# Patient Record
Sex: Female | Born: 1990 | Race: Black or African American | Hispanic: No | Marital: Single | Smoking: Never smoker
Health system: Southern US, Community
[De-identification: ages and names within clinical notes are randomized; demographics above are authoritative.]

## PROBLEM LIST (undated history)

## (undated) HISTORY — PX: BUNIONECTOMY: SHX129

---

## 2015-02-11 ENCOUNTER — Encounter (HOSPITAL_COMMUNITY): Payer: Self-pay | Admitting: Emergency Medicine

## 2015-02-11 ENCOUNTER — Emergency Department (HOSPITAL_COMMUNITY)
Admission: EM | Admit: 2015-02-11 | Discharge: 2015-02-11 | Disposition: A | Discharge status: Home / Self Care | Payer: Self-pay | Attending: Emergency Medicine | Admitting: Emergency Medicine

## 2015-02-11 ENCOUNTER — Emergency Department (HOSPITAL_COMMUNITY): Payer: Self-pay

## 2015-02-11 DIAGNOSIS — M7918 Myalgia, other site: Secondary | ICD-10-CM

## 2015-02-11 DIAGNOSIS — S76911A Strain of unspecified muscles, fascia and tendons at thigh level, right thigh, initial encounter: Secondary | ICD-10-CM | POA: Insufficient documentation

## 2015-02-11 DIAGNOSIS — Y998 Other external cause status: Secondary | ICD-10-CM | POA: Insufficient documentation

## 2015-02-11 DIAGNOSIS — Y9389 Activity, other specified: Secondary | ICD-10-CM | POA: Insufficient documentation

## 2015-02-11 DIAGNOSIS — M79651 Pain in right thigh: Secondary | ICD-10-CM

## 2015-02-11 DIAGNOSIS — Y92410 Unspecified street and highway as the place of occurrence of the external cause: Secondary | ICD-10-CM | POA: Insufficient documentation

## 2015-02-11 DIAGNOSIS — T148XXA Other injury of unspecified body region, initial encounter: Secondary | ICD-10-CM

## 2015-02-11 DIAGNOSIS — Z3202 Encounter for pregnancy test, result negative: Secondary | ICD-10-CM | POA: Insufficient documentation

## 2015-02-11 LAB — POC URINE PREG, ED: PREG TEST UR: NEGATIVE

## 2015-02-11 MED ORDER — IBUPROFEN 800 MG PO TABS
800.0000 mg | ORAL_TABLET | Freq: Once | ORAL | Status: AC
  Administered 2015-02-11: 800 mg via ORAL
  Filled 2015-02-11: qty 1

## 2015-02-11 NOTE — Discharge Instructions (Signed)
Muscle Strain A muscle strain is an injury that occurs when a muscle is stretched beyond its normal length. Usually a small number of muscle fibers are torn when this happens. Muscle strain is rated in degrees. First-degree strains have the least amount of muscle fiber tearing and pain. Second-degree and third-degree strains have increasingly more tearing and pain.  Usually, recovery from muscle strain takes 1-2 weeks. Complete healing takes 5-6 weeks.  CAUSES  Muscle strain happens when a sudden, violent force placed on a muscle stretches it too far. This may occur with lifting, sports, or a fall.  RISK FACTORS Muscle strain is especially common in athletes.  SIGNS AND SYMPTOMS At the site of the muscle strain, there may be:  Pain.  Bruising.  Swelling.  Difficulty using the muscle due to pain or lack of normal function. DIAGNOSIS  Your health care provider will perform a physical exam and ask about your medical history. TREATMENT  Often, the best treatment for a muscle strain is resting, icing, and applying cold compresses to the injured area.  HOME CARE INSTRUCTIONS   Use the PRICE method of treatment to promote muscle healing during the first 2-3 days after your injury. The PRICE method involves:  Protecting the muscle from being injured again.  Restricting your activity and resting the injured body part.  Icing your injury. To do this, put ice in a plastic bag. Place a towel between your skin and the bag. Then, apply the ice and leave it on from 15-20 minutes each hour. After the third day, switch to moist heat packs.  Apply compression to the injured area with a splint or elastic bandage. Be careful not to wrap it too tightly. This may interfere with blood circulation or increase swelling.  Elevate the injured body part above the level of your heart as often as you can.  Only take over-the-counter or prescription medicines for pain, discomfort, or fever as directed by your  health care provider.  Warming up prior to exercise helps to prevent future muscle strains. SEEK MEDICAL CARE IF:   You have increasing pain or swelling in the injured area.  You have numbness, tingling, or a significant loss of strength in the injured area. MAKE SURE YOU:   Understand these instructions.  Will watch your condition.  Will get help right away if you are not doing well or get worse. Document Released: 07/02/2005 Document Revised: 04/22/2013 Document Reviewed: 01/29/2013 [employer] Patient Information 2015 [place], [place]. This information is not intended to replace advice given to you by your health care provider. Make sure you discuss any questions you have with your health care provider.  Musculoskeletal Pain Musculoskeletal pain is muscle and boney aches and pains. These pains can occur in any part of the body. Your caregiver may treat you without knowing the cause of the pain. They may treat you if blood or urine tests, X-rays, and other tests were normal.  CAUSES There is often not a definite cause or reason for these pains. These pains may be caused by a type of germ (virus). The discomfort may also come from overuse. Overuse includes working out too hard when your body is not fit. Boney aches also come from weather changes. Bone is sensitive to atmospheric pressure changes. HOME CARE INSTRUCTIONS   Ask when your test results will be ready. Make sure you get your test results.  Only take over-the-counter or prescription medicines for pain, discomfort, or fever as directed by your caregiver. If you  were given medications for your condition, do not drive, operate machinery or power tools, or sign legal documents for 24 hours. Do not drink alcohol. Do not take sleeping pills or other medications that may interfere with treatment.  Continue all activities unless the activities cause more pain. When the pain lessens, slowly resume normal activities. Gradually increase the  intensity and duration of the activities or exercise.  During periods of severe pain, bed rest may be helpful. Lay or sit in any position that is comfortable.  Putting ice on the injured area.  Put ice in a bag.  Place a towel between your skin and the bag.  Leave the ice on for 15 to 20 minutes, 3 to 4 times a day.  Follow up with your caregiver for continued problems and no reason can be found for the pain. If the pain becomes worse or does not go away, it may be necessary to repeat tests or do additional testing. Your caregiver may need to look further for a possible cause. SEEK IMMEDIATE MEDICAL CARE IF:  You have pain that is getting worse and is not relieved by medications.  You develop chest pain that is associated with shortness or breath, sweating, feeling sick to your stomach (nauseous), or throw up (vomit).  Your pain becomes localized to the abdomen.  You develop any new symptoms that seem different or that concern you. MAKE SURE YOU:   Understand these instructions.  Will watch your condition.  Will get help right away if you are not doing well or get worse. Document Released: 07/02/2005 Document Revised: 09/24/2011 Document Reviewed: 03/06/2013 [facility] Patient Information 2015 [place], [place]. This information is not intended to replace advice given to you by your health care provider. Make sure you discuss any questions you have with your health care provider.
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
## 2015-12-04 ENCOUNTER — Emergency Department (HOSPITAL_COMMUNITY)
Admission: EM | Admit: 2015-12-04 | Discharge: 2015-12-04 | Disposition: A | Discharge status: Home / Self Care | Payer: No Typology Code available for payment source | Attending: Dermatology | Admitting: Dermatology

## 2015-12-04 ENCOUNTER — Encounter (HOSPITAL_COMMUNITY): Payer: Self-pay

## 2015-12-04 DIAGNOSIS — Z5321 Procedure and treatment not carried out due to patient leaving prior to being seen by health care provider: Secondary | ICD-10-CM | POA: Insufficient documentation

## 2015-12-04 DIAGNOSIS — R1110 Vomiting, unspecified: Secondary | ICD-10-CM | POA: Insufficient documentation

## 2015-12-04 DIAGNOSIS — R42 Dizziness and giddiness: Secondary | ICD-10-CM | POA: Insufficient documentation

## 2015-12-04 LAB — POC URINE PREG, ED: Preg Test, Ur: NEGATIVE

## 2015-12-04 LAB — RAPID STREP SCREEN (MED CTR MEBANE ONLY): Streptococcus, Group A Screen (Direct): NEGATIVE

## 2015-12-04 MED ORDER — ONDANSETRON 4 MG PO TBDP: 4.0000 mg | ORAL_TABLET | Freq: Once | ORAL | Status: DC | PRN

## 2015-12-04 NOTE — ED Notes (Signed)
Pt c/o of n/v, sore throat and dizziness that began this AM. Pt denies hx of strept throat. Pt states she had x1 episode of emesis today. Pt A+OX4, speaking in complete sentences, ambulatory to triage.

## 2015-12-04 NOTE — ED Notes (Signed)
Pt not found in lobby x 3 

## 2015-12-04 NOTE — ED Notes (Signed)
Pt called for room no reply from lobby 

## 2015-12-07 LAB — CULTURE, GROUP A STREP (THRC)
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
## 2017-06-27 ENCOUNTER — Telehealth: Payer: Self-pay | Admitting: Neurology
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
## 2017-11-13 ENCOUNTER — Encounter (HOSPITAL_COMMUNITY): Payer: Self-pay | Admitting: *Deleted

## 2017-11-13 ENCOUNTER — Emergency Department (HOSPITAL_COMMUNITY)
Admission: EM | Admit: 2017-11-13 | Discharge: 2017-11-13 | Disposition: A | Discharge status: Home / Self Care | Payer: Managed Care, Other (non HMO) | Attending: Emergency Medicine | Admitting: Emergency Medicine

## 2017-11-13 DIAGNOSIS — R42 Dizziness and giddiness: Secondary | ICD-10-CM | POA: Insufficient documentation

## 2017-11-13 DIAGNOSIS — R202 Paresthesia of skin: Secondary | ICD-10-CM | POA: Insufficient documentation

## 2017-11-13 LAB — I-STAT CHEM 8, ED
BUN: 5 mg/dL — ABNORMAL LOW (ref 6–20)
CHLORIDE: 101 mmol/L (ref 101–111)
CREATININE: 0.8 mg/dL (ref 0.44–1.00)
Calcium, Ion: 1.07 mmol/L — ABNORMAL LOW (ref 1.15–1.40)
Glucose, Bld: 105 mg/dL — ABNORMAL HIGH (ref 65–99)
HCT: 39 % (ref 36.0–46.0)
Hemoglobin: 13.3 g/dL (ref 12.0–15.0)
POTASSIUM: 3.5 mmol/L (ref 3.5–5.1)
Sodium: 140 mmol/L (ref 135–145)
TCO2: 27 mmol/L (ref 22–32)

## 2017-11-13 LAB — I-STAT BETA HCG BLOOD, ED (MC, WL, AP ONLY): I-stat hCG, quantitative: <5 m[IU]/mL (ref <5)

## 2017-11-13 NOTE — ED Triage Notes (Signed)
Pt complains of dizziness, numb sensation in mouth and left arm for the past 2 hours. Pt has difficulty lifting left arm. No facial droop or slurred speech noted in triage.
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
## 2017-11-13 NOTE — Discharge Instructions (Addendum)
It looks like you had symptoms like this previously.  Please follow up with the neurologist so they can make sure that nothing else concerning is going on with you.  Return to the ED for worsening symptoms.  Please let you family doctor know what happened.   Your labwork was normal.
